# Patient Record
Sex: Male | Born: 1985 | Race: Black or African American | Hispanic: No | Marital: Single | Smoking: Never smoker
Health system: Southern US, Community
[De-identification: ages and names within clinical notes are randomized; demographics above are authoritative.]

---

## 2006-06-14 ENCOUNTER — Emergency Department: Payer: Self-pay | Admitting: Emergency Medicine

## 2006-11-02 ENCOUNTER — Emergency Department: Payer: Self-pay | Admitting: Emergency Medicine

## 2006-11-13 ENCOUNTER — Ambulatory Visit: Payer: Self-pay | Admitting: Internal Medicine

## 2006-11-25 ENCOUNTER — Ambulatory Visit: Payer: Self-pay | Admitting: Internal Medicine

## 2008-06-20 IMAGING — CT CT HEAD WITHOUT CONTRAST
2 series · 16 of 30 positions shown, 20 images · non-contrast
Comparison: none

REASON FOR EXAM: dizziness
COMMENTS:

[Series 2: without · axial · non-contrast · 0.43mm/px · z∈[+340,+460]mm · 13 of 29 slices shown, 17 images]
[im 3/29  brain]
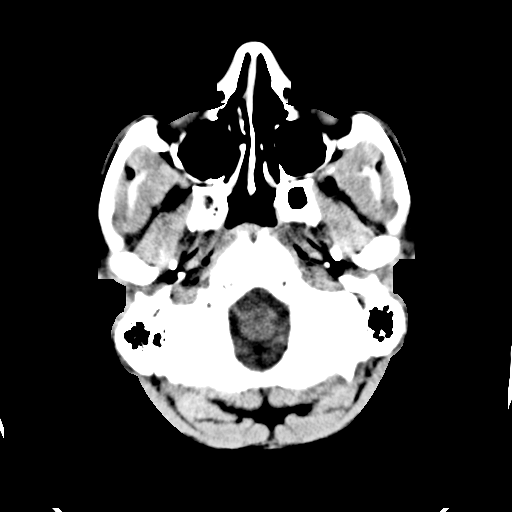
[im 3/29  bone]
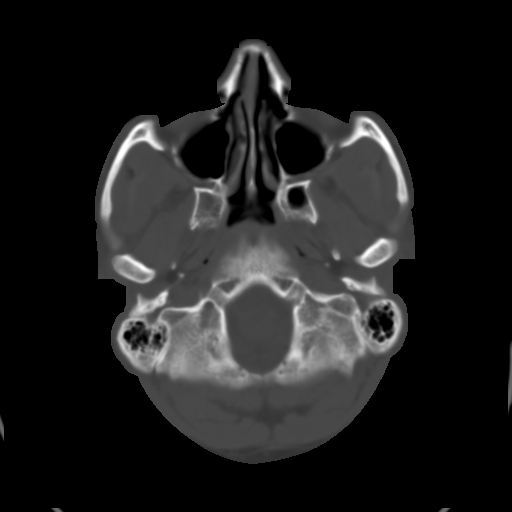
[im 5/29  brain]
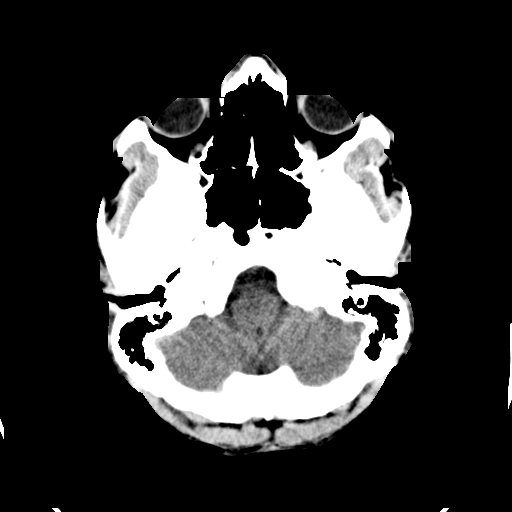
[im 7/29  brain]
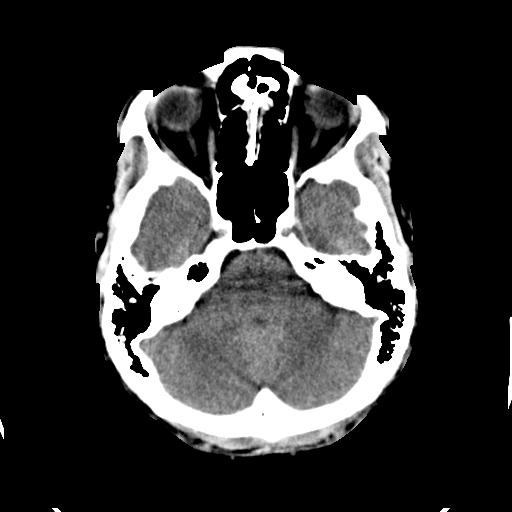
[im 9/29  brain]
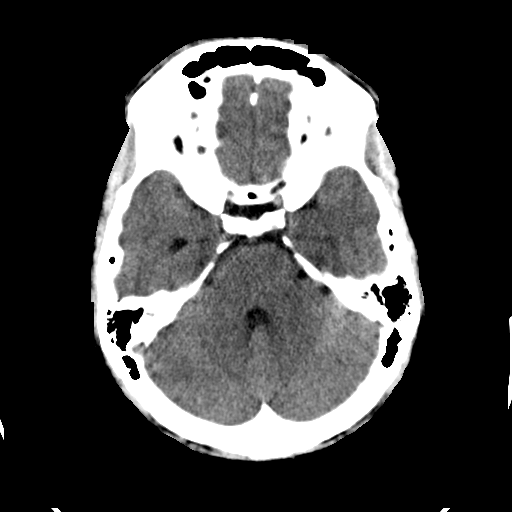
[im 11/29  brain]
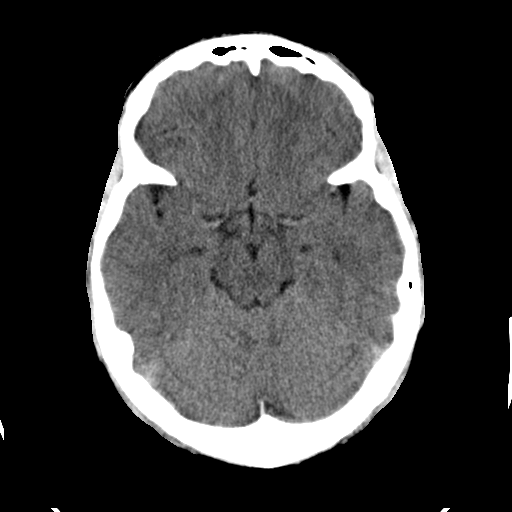
[im 11/29  bone]
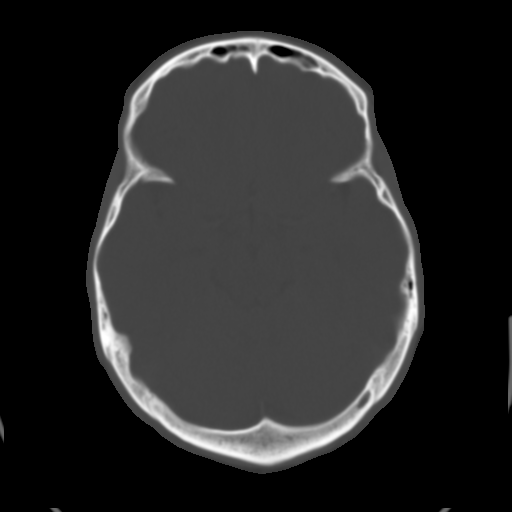
[im 13/29  brain]
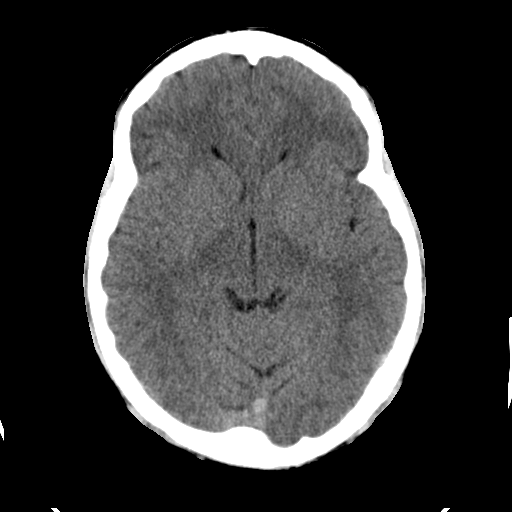
[im 15/29  brain]
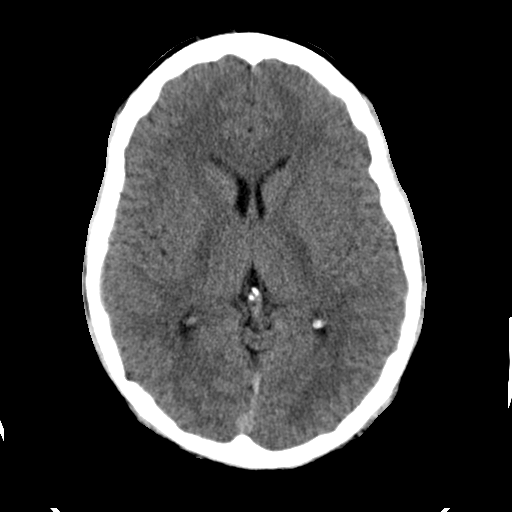
[im 17/29  brain]
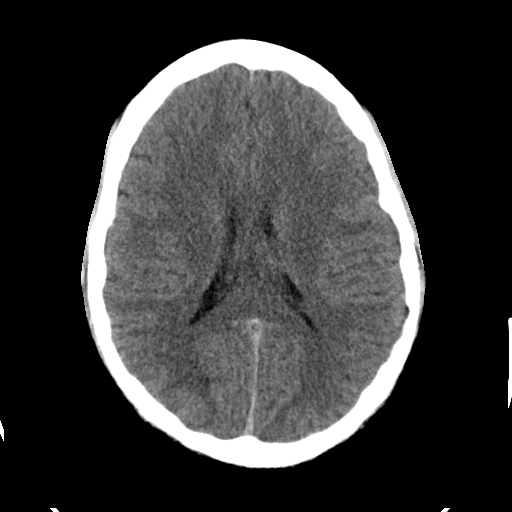
[im 19/29  brain]
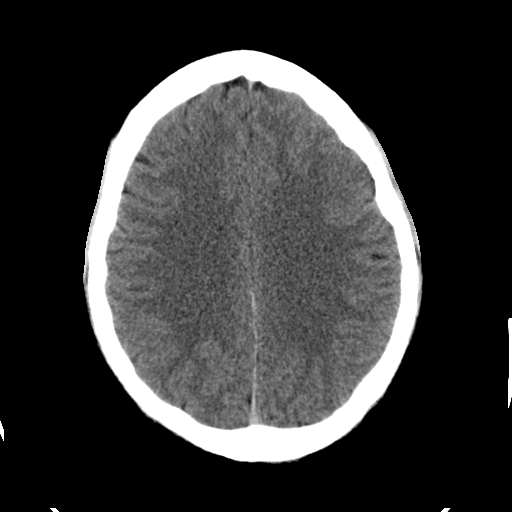
[im 19/29  bone]
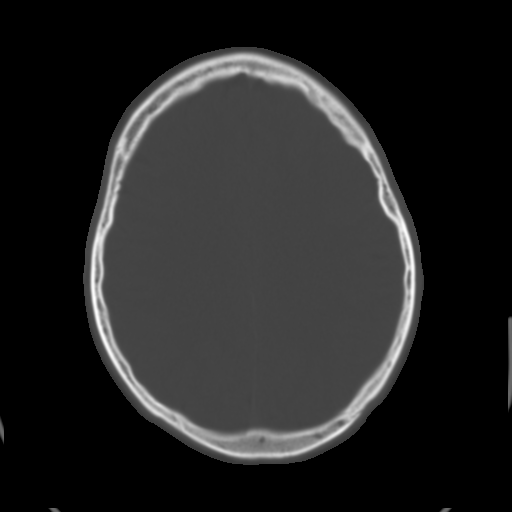
[im 21/29  brain]
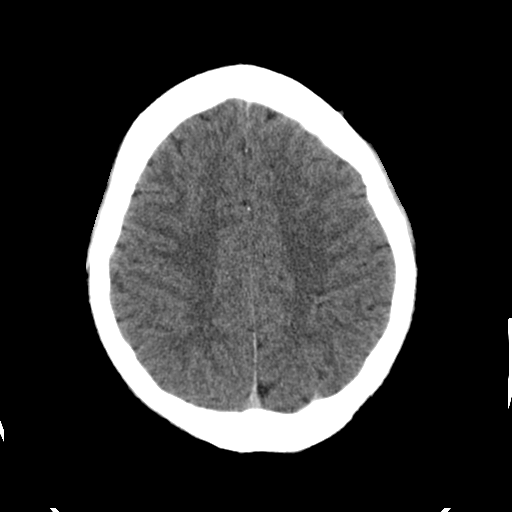
[im 23/29  brain]
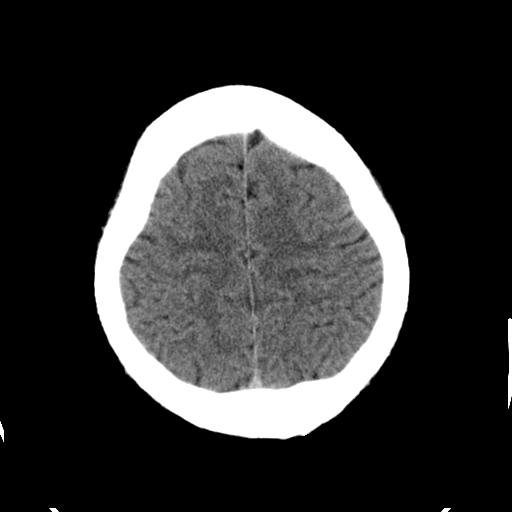
[im 25/29  brain]
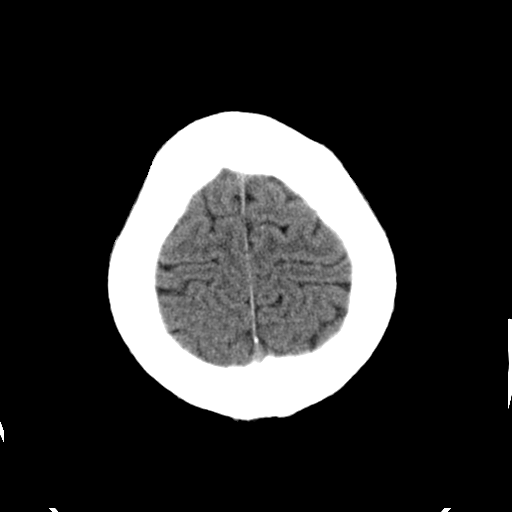
[im 27/29  brain]
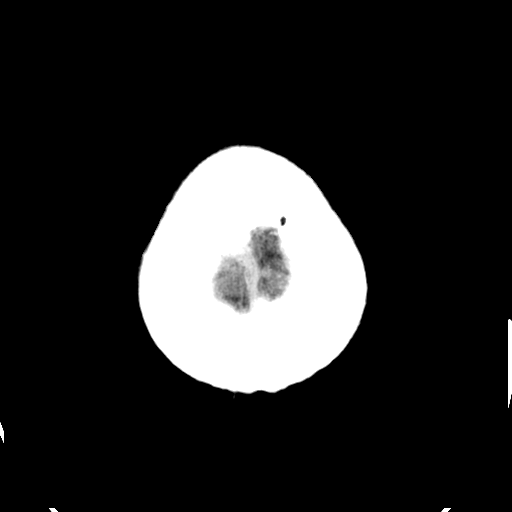
[im 27/29  bone]
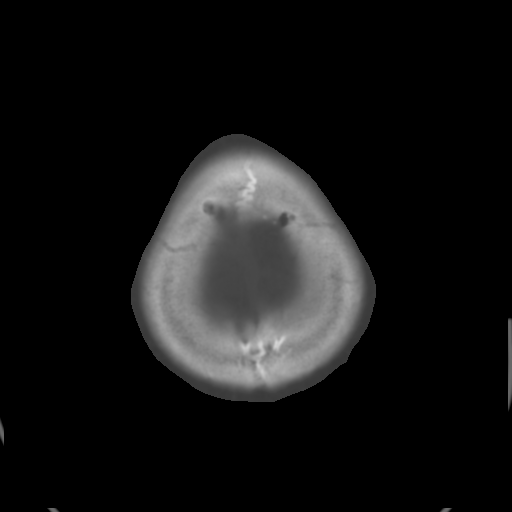

[Series 3: bone · axial · 0.43mm/px · z∈[+340,+380]mm · 3 of 29 slices shown]
[im 3/29  bone]
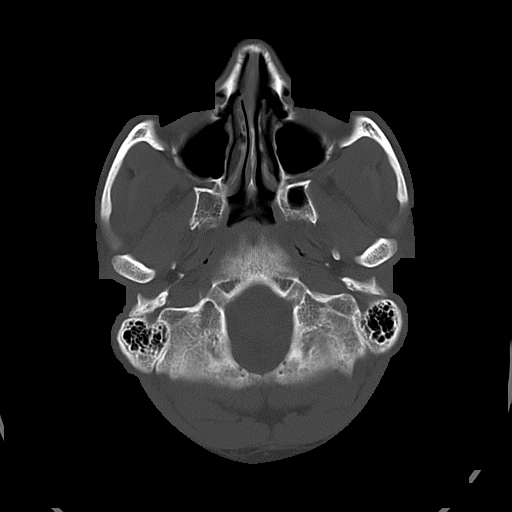
[im 7/29  bone]
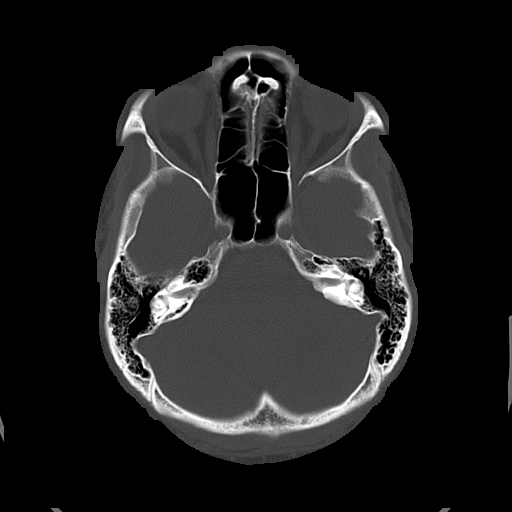
[im 11/29  bone]
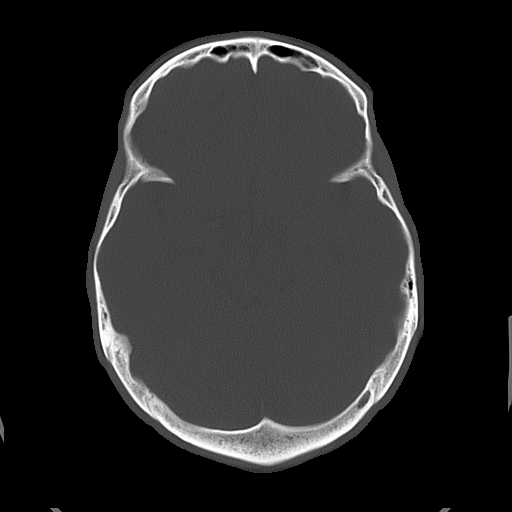

[16 of 30 positions shown; findings below may reference images not displayed]

PROCEDURE:     CT  - CT HEAD WITHOUT CONTRAST  - November 02, 2006  [DATE]

RESULT:          There is no evidence of intraaxial nor extraaxial fluid
collections nor evidence of acute hemorrhage.  No secondary signs are
appreciated to suggest mass effect, subacute or chronic infarction. The
visualized bony skeleton demonstrates no evidence of fracture or
dislocation.  If there are persistent complaints of pain or persistent
clinical concerns, further evaluation with MRI is recommended if clinically
warranted.
IMPRESSION: No evidence of acute intracranial abnormalities.

A preliminary fax report was relayed to Dr. Shankar Salih, of the emergency
department, on 11/02/2006 at [DATE] a.m. EST.

## 2017-12-17 DIAGNOSIS — H0011 Chalazion right upper eyelid: Secondary | ICD-10-CM | POA: Diagnosis not present

## 2018-06-17 ENCOUNTER — Other Ambulatory Visit: Payer: Self-pay

## 2018-06-17 ENCOUNTER — Emergency Department
Admission: EM | Admit: 2018-06-17 | Discharge: 2018-06-18 | Disposition: A | Discharge status: Home / Self Care | Payer: 59 | Attending: Emergency Medicine | Admitting: Emergency Medicine

## 2018-06-17 DIAGNOSIS — I10 Essential (primary) hypertension: Secondary | ICD-10-CM

## 2018-06-17 DIAGNOSIS — R030 Elevated blood-pressure reading, without diagnosis of hypertension: Secondary | ICD-10-CM | POA: Diagnosis not present

## 2018-06-17 DIAGNOSIS — I160 Hypertensive urgency: Secondary | ICD-10-CM | POA: Diagnosis not present

## 2018-06-17 DIAGNOSIS — R61 Generalized hyperhidrosis: Secondary | ICD-10-CM | POA: Diagnosis present

## 2018-06-17 DIAGNOSIS — N289 Disorder of kidney and ureter, unspecified: Secondary | ICD-10-CM | POA: Diagnosis not present

## 2018-06-17 DIAGNOSIS — F17220 Nicotine dependence, chewing tobacco, uncomplicated: Secondary | ICD-10-CM | POA: Insufficient documentation

## 2018-06-17 DIAGNOSIS — E860 Dehydration: Secondary | ICD-10-CM

## 2018-06-17 LAB — CBC
HEMATOCRIT: 44 % (ref 40.0–52.0)
Hemoglobin: 14.9 g/dL (ref 13.0–18.0)
MCH: 28.3 pg (ref 26.0–34.0)
MCHC: 33.8 g/dL (ref 32.0–36.0)
MCV: 83.6 fL (ref 80.0–100.0)
Platelets: 265 10*3/uL (ref 150–440)
RBC: 5.26 MIL/uL (ref 4.40–5.90)
RDW: 13.7 % (ref 11.5–14.5)
WBC: 7 10*3/uL (ref 3.8–10.6)

## 2018-06-17 LAB — BASIC METABOLIC PANEL
ANION GAP: 10 (ref 5–15)
BUN: 12 mg/dL (ref 6–20)
CO2: 26 mmol/L (ref 22–32)
Calcium: 9.2 mg/dL (ref 8.9–10.3)
Chloride: 103 mmol/L (ref 98–111)
Creatinine, Ser: 2.42 mg/dL — ABNORMAL HIGH (ref 0.61–1.24)
GFR, EST AFRICAN AMERICAN: 39 mL/min — AB (ref >60)
GFR, EST NON AFRICAN AMERICAN: 33 mL/min — AB (ref >60)
Glucose, Bld: 99 mg/dL (ref 70–99)
POTASSIUM: 3.4 mmol/L — AB (ref 3.5–5.1)
SODIUM: 139 mmol/L (ref 135–145)

## 2018-06-17 LAB — TROPONIN I: Troponin I: <0.03 ng/mL (ref <0.03)

## 2018-06-17 NOTE — ED Triage Notes (Signed)
Pt arrives to ED for HTN and sweating at his job. Denies having HTN. States it was 152/92. Denies HA or blurred vision today.

## 2018-06-18 LAB — BASIC METABOLIC PANEL
Anion gap: 6 (ref 5–15)
BUN: 10 mg/dL (ref 6–20)
CALCIUM: 8.1 mg/dL — AB (ref 8.9–10.3)
CO2: 25 mmol/L (ref 22–32)
CREATININE: 1.22 mg/dL (ref 0.61–1.24)
Chloride: 109 mmol/L (ref 98–111)
GFR calc non Af Amer: >60 mL/min (ref >60)
Glucose, Bld: 96 mg/dL (ref 70–99)
Potassium: 3.4 mmol/L — ABNORMAL LOW (ref 3.5–5.1)
SODIUM: 140 mmol/L (ref 135–145)

## 2018-06-18 LAB — HEPATIC FUNCTION PANEL
ALT: 43 U/L (ref 0–44)
AST: 32 U/L (ref 15–41)
Albumin: 4.7 g/dL (ref 3.5–5.0)
Alkaline Phosphatase: 47 U/L (ref 38–126)
BILIRUBIN TOTAL: 0.8 mg/dL (ref 0.3–1.2)
Total Protein: 8 g/dL (ref 6.5–8.1)

## 2018-06-18 MED ORDER — SODIUM CHLORIDE 0.9 % IV BOLUS
1000.0000 mL | Freq: Once | INTRAVENOUS | Status: AC
  Administered 2018-06-18: 1000 mL via INTRAVENOUS

## 2018-06-18 MED ORDER — AMLODIPINE BESYLATE 5 MG PO TABS
5.0000 mg | ORAL_TABLET | Freq: Once | ORAL | Status: AC
  Administered 2018-06-18: 5 mg via ORAL
  Filled 2018-06-18: qty 1

## 2018-06-18 MED ORDER — CLONIDINE HCL 0.1 MG PO TABS
0.1000 mg | ORAL_TABLET | Freq: Once | ORAL | Status: DC
  Filled 2018-06-18: qty 1

## 2018-06-18 MED ORDER — AMLODIPINE BESYLATE 5 MG PO TABS: 5.0000 mg | ORAL_TABLET | Freq: Every day | ORAL | 0 refills | Status: AC

## 2018-06-18 NOTE — ED Provider Notes (Signed)
[facility] Emergency Department Provider Note __   First MD Initiated Contact with Patient 06/18/18 0009     (approximate)  I have reviewed the triage vital signs and the nursing notes.   HISTORY  Chief Complaint Hypertension    HPI Clifford Hernandez is a 32 y.o. male with no past medical history presents to the emergency department with hypertension diaphoresis sweating while working night in a hot environment.  Patient denies any confusion.  Patient denies any previous history of hypertension.  Patient's blood pressure on arrival 176/90 current BP 197/120.  Patient denies any chest pain no shortness of breath no leg swelling.  Patient denies any dyspnea.  Patient denies any headache weakness numbness visual changes or gait instability.   History reviewed. No pertinent past medical history.  There are no active problems to display for this patient.   History reviewed. No pertinent surgical history.  Prior to Admission medications   Not on File    Allergies No known drug allergies History reviewed. No pertinent family history.  Social History Social History   Tobacco Use  . Smoking status: Never Smoker  . Smokeless tobacco: Current User    Types: Chew  Substance Use Topics  . Alcohol use: Never    Frequency: Never  . Drug use: Not on file    Review of Systems Constitutional: No fever/chills Eyes: No visual changes. ENT: No sore throat. Cardiovascular: Denies chest pain. Respiratory: Denies shortness of breath. Gastrointestinal: No abdominal pain.  No nausea, no vomiting.  No diarrhea.  No constipation. Genitourinary: Negative for dysuria. Musculoskeletal: Negative for neck pain.  Negative for back pain. Integumentary: Negative for rash. Neurological: Negative for headaches, focal weakness or numbness.   ____________________________________________   PHYSICAL EXAM:  VITAL SIGNS: ED Triage Vitals  Enc Vitals Group     BP  06/17/18 2025 (!) 176/90     Pulse Rate 06/17/18 2025 72     Resp 06/17/18 2025 18     Temp 06/17/18 2025 98.6 F (37 C)     Temp Source 06/17/18 2025 Oral     SpO2 06/17/18 2025 100 %     Weight 06/17/18 2026 83.9 kg (185 lb)     Height 06/17/18 2026 1.702 m (5\' 7" )     Head Circumference --      Peak Flow --      Pain Score 06/17/18 2026 0     Pain Loc --      Pain Edu? --      Excl. in GC? --     Constitutional: Alert and oriented. Well appearing and in no acute distress. Eyes: Conjunctivae are normal.  Head: Atraumatic. Mouth/Throat: Mucous membranes are moist. Oropharynx non-erythematous. Neck: No stridor.  Cardiovascular: Normal rate, regular rhythm. Good peripheral circulation. Grossly normal heart sounds. Respiratory: Normal respiratory effort.  No retractions. Lungs CTAB. Gastrointestinal: Soft and nontender. No distention.  Musculoskeletal: No lower extremity tenderness nor edema. No gross deformities of extremities. Neurologic:  Normal speech and language. No gross focal neurologic deficits are appreciated.  Skin:  Skin is warm, dry and intact. No rash noted.   ____________________________________________   LABS (all labs ordered are listed, but only abnormal results are displayed)  Labs Reviewed  BASIC METABOLIC PANEL - Abnormal; Notable for the following components:      Result Value   Potassium 3.4 (*)    Creatinine, Ser 2.42 (*)    GFR calc non Af Amer 33 (*)  GFR calc Af Amer 39 (*)    All other components within normal limits  CBC  TROPONIN I   ____________________________________________  EKG  ED ECG REPORT I, [place] Lakelynn Severtson, the attending physician, personally viewed and interpreted this ECG.   Date: 06/18/2018  EKG Time: 8:32 PM  Rate: 91  Rhythm: Normal sinus rhythm  Axis: Normal  Intervals: Normal  ST&T Change: Inverted T waves lead III and aVF.     Procedures   ____________________________________________   INITIAL  IMPRESSION / ASSESSMENT AND PLAN / ED COURSE  As part of my medical decision making, I reviewed the following data within the electronic MEDICAL RECORD NUMBER   32 year old male presenting with above-stated history and physical exam secondary.  Patient hypertensive on arrival with a blood pressure 176/90.  Laboratory data revealed an elevated creatinine of 2.42.  Patient given 2 L IV normal saline with repeat BMP revealing a creatinine of 1.22 and as such suspect dehydration as the etiology for the patient's elevated creatinine.  Patient given Norvasc 5 mg with resultant blood pressure 133/98.  Patient has no symptoms at present.  Patient will be referred to Dr. Thedore MinsSingh for further outpatient evaluation and management of hypertension.    ____________________________________________  FINAL CLINICAL IMPRESSION(S) / ED DIAGNOSES  Final diagnoses:  Dehydration  Hypertension, unspecified type     MEDICATIONS GIVEN DURING THIS VISIT:  Medications - No data to display   ED Discharge Orders    None       Note:  This document was prepared using Dragon voice recognition software and may include unintentional dictation errors.    Darci CurrentBrown, [place], MD 06/18/18 [phone]

## 2018-06-18 NOTE — ED Notes (Signed)
BMP blood sent down to lab

## 2018-06-18 NOTE — ED Notes (Signed)
Patient up to use restroom and fluids are almost finished.

## 2018-06-18 NOTE — ED Notes (Signed)
Patient was at work and stated he felt hot and BP was high. Patient states he has a little bit of headache. Denies CP

## 2018-06-25 ENCOUNTER — Other Ambulatory Visit: Payer: Self-pay | Admitting: Internal Medicine

## 2018-06-25 DIAGNOSIS — I10 Essential (primary) hypertension: Secondary | ICD-10-CM | POA: Diagnosis not present

## 2018-06-25 DIAGNOSIS — R7989 Other specified abnormal findings of blood chemistry: Secondary | ICD-10-CM | POA: Diagnosis not present

## 2018-07-03 ENCOUNTER — Ambulatory Visit
Admission: RE | Admit: 2018-07-03 | Discharge: 2018-07-03 | Disposition: A | Discharge status: Home / Self Care | Payer: 59 | Source: Ambulatory Visit | Attending: Internal Medicine | Admitting: Internal Medicine

## 2018-07-03 DIAGNOSIS — R7989 Other specified abnormal findings of blood chemistry: Secondary | ICD-10-CM | POA: Insufficient documentation

## 2018-07-14 DIAGNOSIS — I10 Essential (primary) hypertension: Secondary | ICD-10-CM | POA: Diagnosis not present

## 2019-01-14 DIAGNOSIS — I10 Essential (primary) hypertension: Secondary | ICD-10-CM | POA: Diagnosis not present
